# Patient Record
Sex: Male | Born: 1990 | Race: White | Hispanic: No | Marital: Single
Health system: Southern US, Community
[De-identification: ages and names within clinical notes are randomized; demographics above are authoritative.]

---

## 2019-06-11 ENCOUNTER — Emergency Department (HOSPITAL_COMMUNITY)
Admission: EM | Admit: 2019-06-11 | Discharge: 2019-06-11 | Disposition: A | Discharge status: Home / Self Care | Payer: Medicaid Other | Attending: Emergency Medicine | Admitting: Emergency Medicine

## 2019-06-11 ENCOUNTER — Emergency Department (HOSPITAL_COMMUNITY): Payer: Medicaid Other

## 2019-06-11 ENCOUNTER — Other Ambulatory Visit: Payer: Self-pay

## 2019-06-11 DIAGNOSIS — S62392A Other fracture of third metacarpal bone, right hand, initial encounter for closed fracture: Secondary | ICD-10-CM | POA: Insufficient documentation

## 2019-06-11 DIAGNOSIS — Y999 Unspecified external cause status: Secondary | ICD-10-CM | POA: Insufficient documentation

## 2019-06-11 DIAGNOSIS — W231XXA Caught, crushed, jammed, or pinched between stationary objects, initial encounter: Secondary | ICD-10-CM | POA: Insufficient documentation

## 2019-06-11 DIAGNOSIS — Z23 Encounter for immunization: Secondary | ICD-10-CM | POA: Insufficient documentation

## 2019-06-11 DIAGNOSIS — Y9389 Activity, other specified: Secondary | ICD-10-CM | POA: Insufficient documentation

## 2019-06-11 DIAGNOSIS — Y929 Unspecified place or not applicable: Secondary | ICD-10-CM | POA: Insufficient documentation

## 2019-06-11 MED ORDER — TETANUS-DIPHTHERIA TOXOIDS TD 5-2 LFU IM INJ: 0.5000 mL | INJECTION | Freq: Once | INTRAMUSCULAR | Status: DC

## 2019-06-11 MED ORDER — TETANUS-DIPHTH-ACELL PERTUSSIS 5-2.5-18.5 LF-MCG/0.5 IM SUSP
0.5000 mL | Freq: Once | INTRAMUSCULAR | Status: AC
  Administered 2019-06-11: 0.5 mL via INTRAMUSCULAR
  Filled 2019-06-11: qty 0.5

## 2019-06-11 NOTE — ED Triage Notes (Signed)
Pt report injured right hand while moving a sleep member bed frame slammed down on his hand

## 2019-06-11 NOTE — Progress Notes (Signed)
Orthopedic Tech Progress Note Patient Details:  Bryan Henson [birth year] [id number]  Ortho Devices Type of Ortho Device: Cotton web roll, Ace wrap, Ulna gutter splint Ortho Device/Splint Location: RUE Ortho Device/Splint Interventions: Ordered, Application   Post Interventions Patient Tolerated: Well Instructions Provided: Care of device   Ancil Linsey 06/11/2019, 7:55 AM

## 2019-06-11 NOTE — ED Provider Notes (Signed)
[place] DEPT Provider Note   CSN: [id number] Arrival date & time: 06/11/19  0254     History Chief Complaint  Patient presents with  . Hand Injury    Bryan Henson is a 29 y.o. male.  29 year old male who sustained a crush injury to his right hand last night while moving a piece of furniture.  Notes severe discomfort to the dorsal aspect of his right hand.  Denies any distal numbness or tingling to the fingers.  No wrist discomfort.  Symptoms better with remaining still.  No treatment use prior to arrival        No past medical history on file.  There are no problems to display for this patient.        No family history on file.  Social History   Tobacco Use  . Smoking status: Not on file  Substance Use Topics  . Alcohol use: Not on file  . Drug use: Not on file    Home Medications Prior to Admission medications   Not on File    Allergies    Paxil [paroxetine hcl]  Review of Systems   Review of Systems  All other systems reviewed and are negative.   Physical Exam Updated Vital Signs BP 124/70   Pulse 74   Temp 98 F (36.7 C) (Oral)   Resp 18   Ht 1.88 m (6\' 2" )   Wt 68 kg   SpO2 98%   BMI 19.26 kg/m   Physical Exam Vitals and nursing note reviewed.  Constitutional:      General: He is not in acute distress.    Appearance: Normal appearance. He is well-developed. He is not toxic-appearing.  HENT:     Head: Normocephalic and atraumatic.  Eyes:     General: Lids are normal.     Conjunctiva/sclera: Conjunctivae normal.     Pupils: Pupils are equal, round, and reactive to light.  Neck:     Thyroid: No thyroid mass.     Trachea: No tracheal deviation.  Cardiovascular:     Rate and Rhythm: Normal rate and regular rhythm.     Heart sounds: Normal heart sounds. No murmur. No gallop.   Pulmonary:     Effort: Pulmonary effort is normal. No respiratory distress.     Breath sounds: Normal breath sounds. No  stridor. No decreased breath sounds, wheezing, rhonchi or rales.  Abdominal:     General: Bowel sounds are normal. There is no distension.     Palpations: Abdomen is soft.     Tenderness: There is no abdominal tenderness. There is no rebound.  Musculoskeletal:        General: Normal range of motion.     Right hand: Swelling and tenderness present.     Cervical back: Normal range of motion and neck supple.     Comments: Abrasion noted to dorsal surface of right hand at about the level of the third metacarpal  Skin:    General: Skin is warm and dry.     Findings: No abrasion or rash.  Neurological:     Mental Status: He is alert and oriented to person, place, and time.     GCS: GCS eye subscore is 4. GCS verbal subscore is 5. GCS motor subscore is 6.     Cranial Nerves: No cranial nerve deficit.     Sensory: No sensory deficit.  Psychiatric:        Speech: Speech normal.  Behavior: Behavior normal.     ED Results / Procedures / Treatments   Labs (all labs ordered are listed, but only abnormal results are displayed) Labs Reviewed - No data to display  EKG None  Radiology DG Hand Complete Right  Result Date: 06/11/2019 CLINICAL DATA:  Edema and pain after crush injury EXAM: RIGHT HAND - COMPLETE 3+ VIEW COMPARISON:  None. FINDINGS: There is a vertically oriented linear lucency seen at the ulnar aspect of the third metacarpal head, likely nondisplaced fracture. Significant overlying dorsal soft tissue swelling is seen. IMPRESSION: Nondisplaced third metacarpal head fracture. Significant dorsal soft tissue swelling. Electronically Signed   By: Jonna Clark M.D.   On: 06/11/2019 03:50    Procedures Procedures (including critical care time)  Medications Ordered in ED Medications  tetanus & diphtheria toxoids (adult) (TENIVAC) injection 0.5 mL (has no administration in time range)    ED Course  I have reviewed the triage vital signs and the nursing notes.  Pertinent labs  & imaging results that were available during my care of the patient were reviewed by me and considered in my medical decision making (see chart for details).    MDM Rules/Calculators/A&P                      Tetanus status has been updated.  Splint applied by orthopedic tech.  Will give referral to hand surgery Final Clinical Impression(s) / ED Diagnoses Final diagnoses:  None    Rx / DC Orders ED Discharge Orders    None       Lorre Nick, MD 06/11/19 0725

## 2019-06-11 NOTE — Discharge Instructions (Signed)
Use Tylenol and/or Motrin as directed for pain. °

## 2019-06-11 NOTE — ED Notes (Signed)
Secretary notified to page ortho tech for splint.

## 2021-05-20 IMAGING — CR DG HAND COMPLETE 3+V*R*
3 series · 3 of 3 positions shown · non-contrast
Comparison: None.

CLINICAL DATA: Edema and pain after crush injury

EXAM:
RIGHT HAND - COMPLETE 3+ VIEW

[x hand pa right]
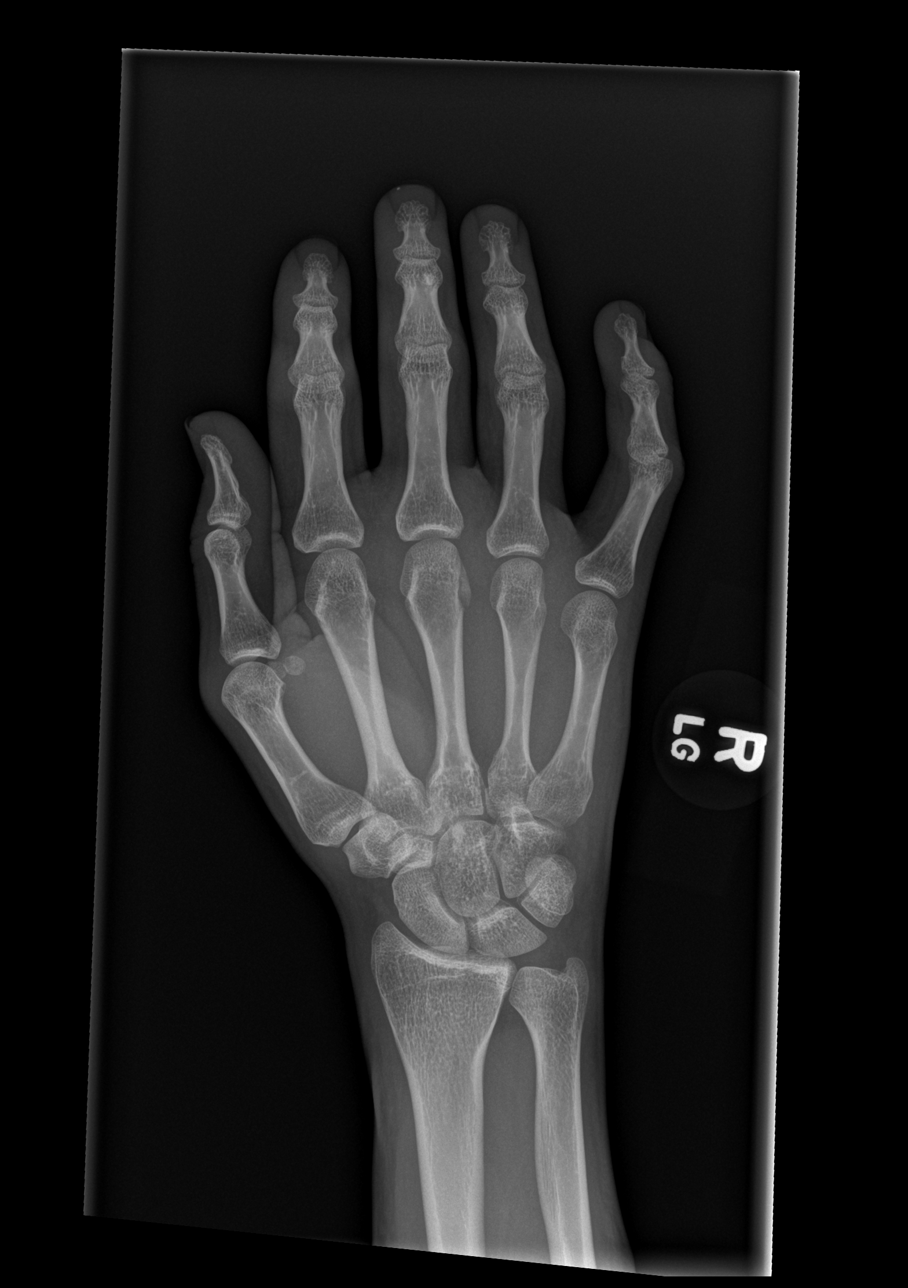

[x hand obl right]
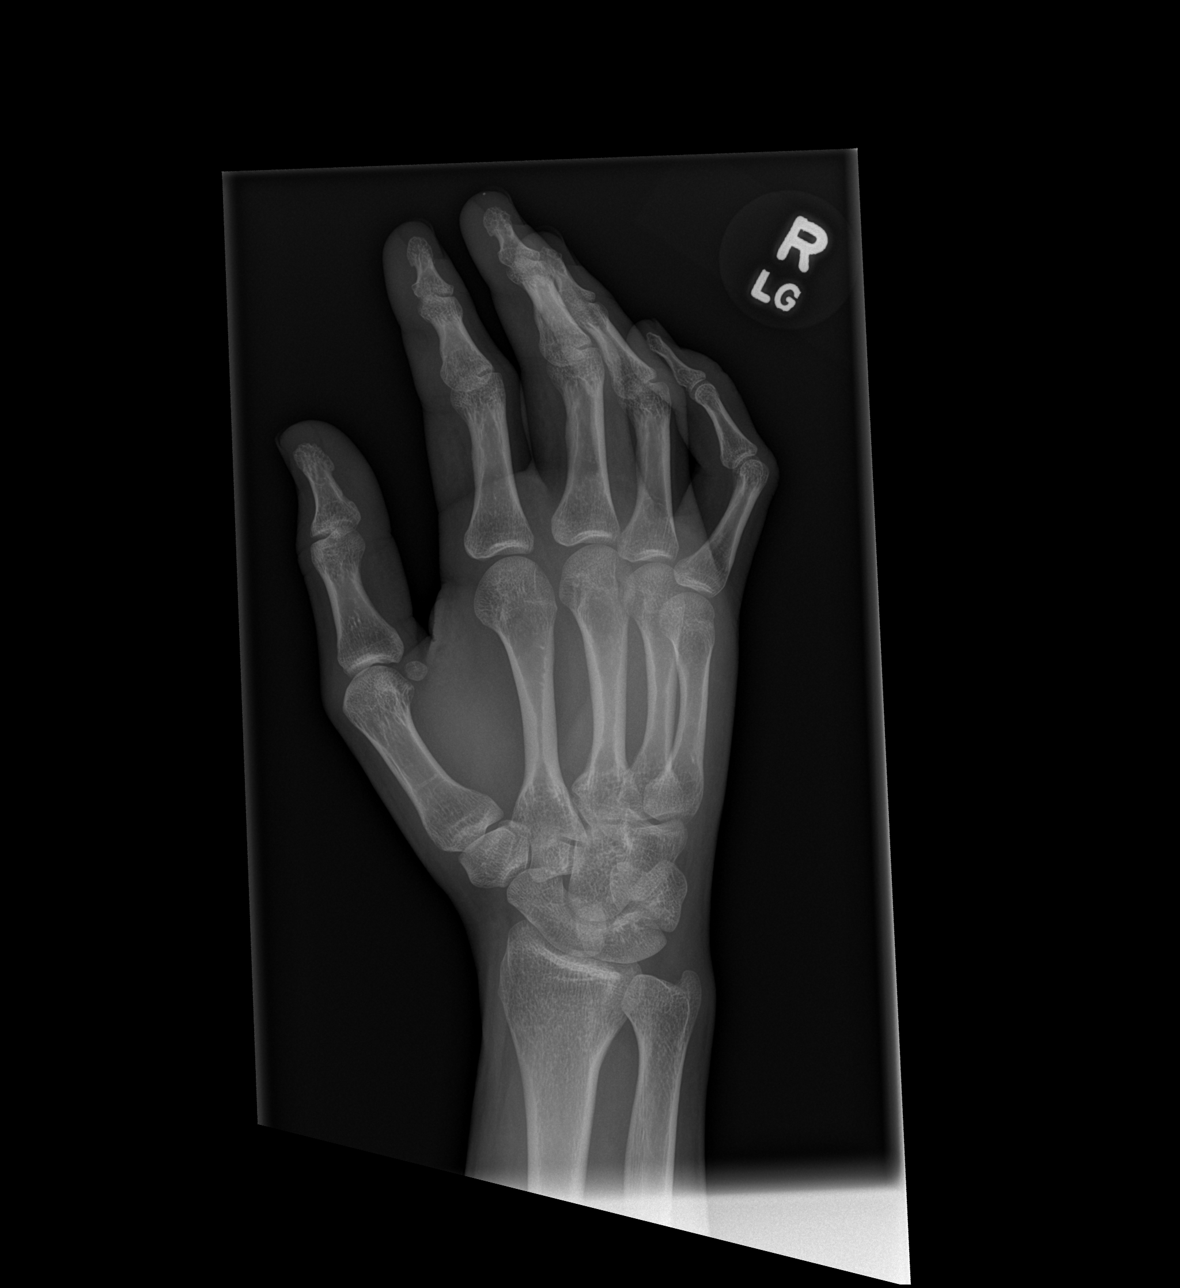

[x hand lat right]
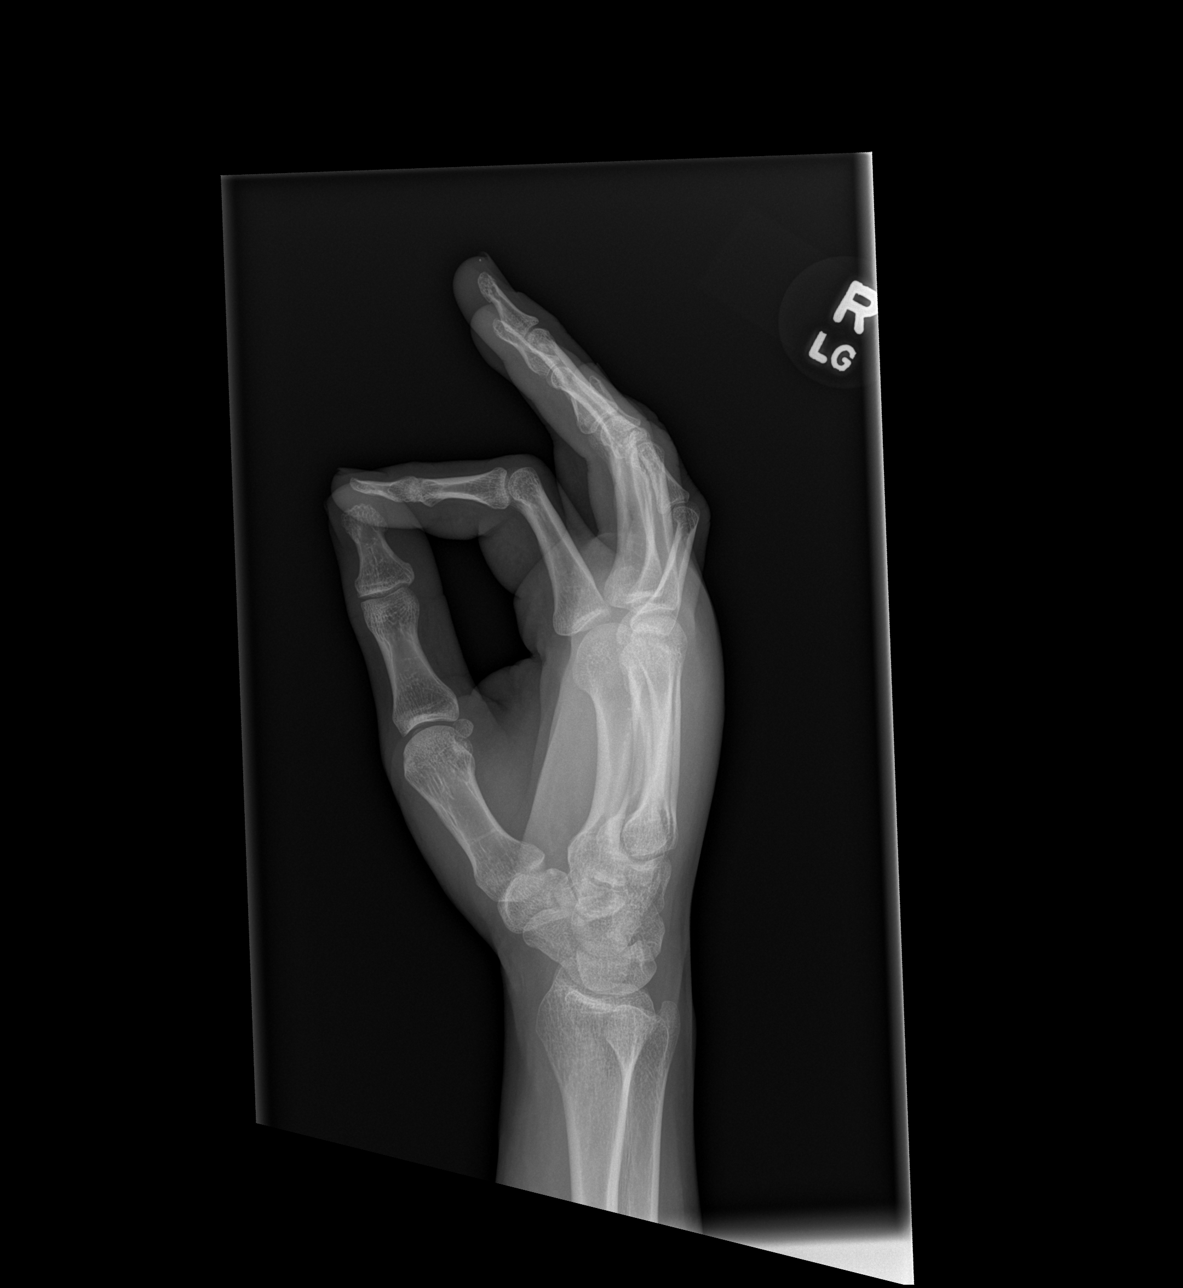

[3 of 3 positions shown; findings below may reference images not displayed]

FINDINGS: There is a vertically oriented linear lucency seen at the ulnar
aspect of the third metacarpal head, likely nondisplaced fracture.
Significant overlying dorsal soft tissue swelling is seen.
IMPRESSION: Nondisplaced third metacarpal head fracture. Significant dorsal soft
tissue swelling.
